# Patient Record
Sex: Male | Born: 1995 | Race: Black or African American | Hispanic: No | Marital: Single | Smoking: Current every day smoker
Health system: Southern US, Community
[De-identification: ages and names within clinical notes are randomized; demographics above are authoritative.]

## PROBLEM LIST (undated history)

## (undated) HISTORY — PX: TONSILLECTOMY: SUR1361

---

## 2016-01-17 ENCOUNTER — Emergency Department (HOSPITAL_COMMUNITY)
Admission: EM | Admit: 2016-01-17 | Discharge: 2016-01-17 | Disposition: A | Discharge status: Home / Self Care | Payer: Self-pay | Source: Home / Self Care

## 2016-01-17 NOTE — ED Notes (Signed)
No answer in waiting room 

## 2016-01-17 NOTE — ED Notes (Signed)
No answer x 2 

## 2016-01-18 ENCOUNTER — Encounter (HOSPITAL_COMMUNITY): Payer: Self-pay | Admitting: Emergency Medicine

## 2016-01-18 ENCOUNTER — Emergency Department (HOSPITAL_COMMUNITY)
Admission: EM | Admit: 2016-01-18 | Discharge: 2016-01-18 | Disposition: A | Discharge status: Home / Self Care | Payer: Self-pay | Attending: Emergency Medicine | Admitting: Emergency Medicine

## 2016-01-18 ENCOUNTER — Emergency Department (HOSPITAL_COMMUNITY): Payer: Self-pay

## 2016-01-18 DIAGNOSIS — K5900 Constipation, unspecified: Secondary | ICD-10-CM | POA: Insufficient documentation

## 2016-01-18 DIAGNOSIS — F17200 Nicotine dependence, unspecified, uncomplicated: Secondary | ICD-10-CM | POA: Insufficient documentation

## 2016-01-18 DIAGNOSIS — J069 Acute upper respiratory infection, unspecified: Secondary | ICD-10-CM | POA: Insufficient documentation

## 2016-01-18 DIAGNOSIS — R52 Pain, unspecified: Secondary | ICD-10-CM

## 2016-01-18 DIAGNOSIS — R059 Cough, unspecified: Secondary | ICD-10-CM

## 2016-01-18 DIAGNOSIS — H9203 Otalgia, bilateral: Secondary | ICD-10-CM | POA: Insufficient documentation

## 2016-01-18 DIAGNOSIS — R05 Cough: Secondary | ICD-10-CM

## 2016-01-18 DIAGNOSIS — J029 Acute pharyngitis, unspecified: Secondary | ICD-10-CM

## 2016-01-18 LAB — RAPID STREP SCREEN (MED CTR MEBANE ONLY): Streptococcus, Group A Screen (Direct): NEGATIVE

## 2016-01-18 MED ORDER — NAPROXEN 250 MG PO TABS
500.0000 mg | ORAL_TABLET | Freq: Once | ORAL | Status: AC
  Administered 2016-01-18: 500 mg via ORAL
  Filled 2016-01-18: qty 2

## 2016-01-18 MED ORDER — POLYETHYLENE GLYCOL 3350 17 G PO PACK: 17.0000 g | PACK | Freq: Every day | ORAL | Status: AC

## 2016-01-18 NOTE — ED Provider Notes (Signed)
History  By signing my name below, I, Alexander Weeks, attest that this documentation has been prepared under the direction and in the presence of [name], [employer]. Electronically Signed: Karle Weeks, ED Scribe. 01/18/2016. 10:51 AM.  Chief Complaint  Patient presents with  . Sore Throat  . Cough   Patient is a 20 y.o. male presenting with pharyngitis and cough. The history is provided by the patient and medical records. No language interpreter was used.  Sore Throat This is a new problem. The current episode started more than 2 days ago. The problem occurs constantly. The problem has not changed since onset.Associated symptoms include headaches. Pertinent negatives include no chest pain, no abdominal pain and no shortness of breath. Nothing aggravates the symptoms. The symptoms are relieved by acetaminophen. He has tried acetaminophen for the symptoms. The treatment provided mild relief.  Cough Cough characteristics:  Productive Sputum characteristics:  Yellow Duration:  3 days Timing:  Intermittent Progression:  Unchanged Chronicity:  New Relieved by:  Nothing Worsened by:  Nothing tried Associated symptoms: ear pain, headaches, myalgias (generalized), rhinorrhea, sinus congestion, sore throat and wheezing (improves with inhaler)   Associated symptoms: no chest pain, no chills, no eye discharge, no fever and no shortness of breath   Ear pain:    Location:  Bilateral Rhinorrhea:    Quality:  Yellow   Severity:  Mild   Duration:  3 days   Timing:  Intermittent   Progression:  Unchanged Sore throat:    Severity:  Mild   Duration:  3 days   Timing:  Intermittent   HPI Comments:  Alexander Weeks is a 20 y.o. male with a PMHx of asthma, who presents to the Emergency Department complaining of productive cough of yellow mucous that began two days ago. He reports associated HA, generalized body aches, bilateral otalgia, congestion, sore throat, wheezing, and yellow  rhinorrhea. He reports not having a bowel movement in 2-3 days but states he is still able to pass gas. He reports taking Tylenol with some relief of the symptoms. He has also used his home nebulizer machine with relief of the wheezing. He denies modifying factors. Pt denies any known sick contacts. He denies drooling, trismus, abdominal pain, fever, chills, CP, SOB, inability to swallow, vomiting, nausea, diarrhea, obstipation, dysuria, hematuria, numbness, tingling or weakness of any extremity.   History reviewed. No pertinent past medical history. Past Surgical History  Procedure Laterality Date  . Tonsillectomy     No family history on file. Social History  Substance Use Topics  . Smoking status: Current Every Day Smoker  . Smokeless tobacco: None  . Alcohol Use: Yes    Review of Systems  Constitutional: Negative for fever and chills.  HENT: Positive for congestion, ear pain, rhinorrhea and sore throat. Negative for drooling, ear discharge, hearing loss and trouble swallowing.   Eyes: Negative for pain, discharge and visual disturbance.  Respiratory: Positive for cough and wheezing (improves with inhaler). Negative for shortness of breath.   Cardiovascular: Negative for chest pain.  Gastrointestinal: Positive for constipation. Negative for nausea, vomiting, abdominal pain and diarrhea.  Genitourinary: Negative for dysuria and hematuria.  Musculoskeletal: Positive for myalgias (generalized).  Skin: Negative for color change.  Allergic/Immunologic: Negative for immunocompromised state.  Neurological: Positive for headaches. Negative for weakness and numbness.  Psychiatric/Behavioral: Negative for confusion.   A complete 10 system review of systems was obtained and all systems are negative except as noted in the HPI and PMH.   Allergies  Review of patient's allergies indicates no known allergies.  Home Medications   Prior to Admission medications   Not on File   Triage Vitals:  BP 147/63 mmHg  Pulse 84  Temp(Src) 99.4 F (37.4 C) (Oral)  Resp 20  SpO2 100% Physical Exam  Constitutional: He is oriented to person, place, and time. Vital signs are normal. He appears well-developed and well-nourished.  Non-toxic appearance. No distress.  Low-grade temp 99.4, nontoxic, NAD  HENT:  Head: Normocephalic and atraumatic.  Right Ear: Hearing, tympanic membrane, external ear and ear canal normal.  Left Ear: Hearing, tympanic membrane, external ear and ear canal normal.  Nose: Mucosal edema and rhinorrhea present.  Mouth/Throat: Uvula is midline and mucous membranes are normal. No trismus in the jaw. No uvula swelling. Posterior oropharyngeal erythema present. No oropharyngeal exudate, posterior oropharyngeal edema or tonsillar abscesses.  Ears are clear bilaterally. Nose with mucosal edema and rhinorrhea. Oropharynx injected, without uvular swelling or deviation, no trismus or drooling, no tonsillar swelling, no exudates. No PTA.  Eyes: Conjunctivae and EOM are normal. Right eye exhibits no discharge. Left eye exhibits no discharge.  Neck: Normal range of motion. Neck supple.  Cardiovascular: Normal rate, regular rhythm, normal heart sounds and intact distal pulses.  Exam reveals no gallop and no friction rub.   No murmur heard. Pulmonary/Chest: Effort normal and breath sounds normal. No respiratory distress. He has no decreased breath sounds. He has no wheezes. He has no rhonchi. He has no rales.  CTAB in all lung fields, no w/r/r, no hypoxia or increased WOB, speaking in full sentences, SpO2 100% on RA  Abdominal: Soft. Normal appearance and bowel sounds are normal. He exhibits no distension. There is no tenderness. There is no rigidity, no rebound, no guarding, no CVA tenderness, no tenderness at McBurney's point and negative Murphy's sign.  Soft, NTND, +BS throughout, no r/g/r, neg murphy's, neg mcburney's, no CVA TTP  Musculoskeletal: Normal range of motion.   Lymphadenopathy:    He has cervical adenopathy.  Shotty cervical LAD bilaterally  Neurological: He is alert and oriented to person, place, and time. He has normal strength. No sensory deficit.  Skin: Skin is warm, dry and intact. No rash noted.  Psychiatric: He has a normal mood and affect.  Nursing note and vitals reviewed.   ED Course  Procedures (including critical care time) DIAGNOSTIC STUDIES: Oxygen Saturation is 100% on RA, normal by my interpretation.   COORDINATION OF CARE: 9:54 AM- Will order CXR and rapid strep test. Pt verbalizes understanding and agrees to plan.  Medications  naproxen (NAPROSYN) tablet 500 mg (500 mg Oral Given 01/18/16 1021)    Labs Review Labs Reviewed  RAPID STREP SCREEN (NOT AT [facility])  CULTURE, GROUP A STREP [facility])   Imaging Review Dg Chest 2 View  01/18/2016  CLINICAL DATA:  Cough and fever.  History of smoking. EXAM: CHEST  2 VIEW COMPARISON:  None. FINDINGS: The heart size and mediastinal contours are within normal limits. Both lungs are clear. No pleural effusion or pneumothorax. The visualized skeletal structures are unremarkable. IMPRESSION: Normal chest radiographs. Electronically Signed   By: Amie Portland M.D.   On: 01/18/2016 10:30   I have personally reviewed and evaluated these images and lab results as part of my medical decision-making.   MDM   Final diagnoses:  URI (upper respiratory infection)  Cough  Pharyngitis  Body aches  Constipation, unspecified constipation type    20 y.o. male here with multiple complaints, mostly URI  complaints and some constipation. Low grade temp. Mild rhinorrhea, pharynx injected without PTA or tonsillar swelling, no exudates. Clear lung exam. No abdominal tenderness. Will give naprosyn for his body aches, and obtain CXR and RST. Will reassess shortly. Doubt need for abdominal work up, considering theres no tenderness, pt tolerating PO well, and doubt need for imaging/labs.   12:11 PM Xray neg.  RST neg. Likely viral URI. Pt is agreeable to symptomatic treatment with close follow up with [facility] in 1wk to establish care and as needed but spoke at length about emergent changing or worsening of symptoms that should prompt return to ER. Pt voices understanding and is agreeable to plan. Stable at time of discharge. Also dicussed miralax, high fiber diet, and increased water intake.  I personally performed the services described in this documentation, which was scribed in my presence. The recorded information has been reviewed and is accurate.  BP 144/84 mmHg  Pulse 80  Temp(Src) 99.6 F (37.6 C) (Oral)  Resp 16  SpO2 99%  Meds ordered this encounter  Medications  . naproxen (NAPROSYN) tablet 500 mg    Sig:   . polyethylene glycol (MIRALAX / GLYCOLAX) packet    Sig: Take 17 g by mouth daily.    Dispense:  14 each    Refill:  0    Order Specific Question:  Supervising Provider    Answer:  Eber Hong [[id number]]     Eddy Liszewski Camprubi-Soms, PA-C 01/18/16 1216  Gerhard Munch, MD 01/22/16 [phone]

## 2016-01-18 NOTE — Discharge Instructions (Signed)
Continue to stay well-hydrated. Gargle warm salt water and spit it out. Continue to alternate between Tylenol and Ibuprofen for pain or fever. Use Mucinex for cough suppression/expectoration of mucus. Use netipot and flonase to help with nasal congestion. May consider over-the-counter Benadryl or other antihistamine to decrease secretions and for watery itchy eyes.  For your constipation, use miralax as directed until you have daily soft stools, then as needed to continue having daily soft stools. Increase fiber and water intake.  Followup with [facility] in 5-7 days for recheck of ongoing symptoms and to establish care. Return to emergency department for emergent changing or worsening of symptoms.   Viral Infections A virus is a type of germ. Viruses can cause:  Minor sore throats.  Aches and pains.  Headaches.  Runny nose.  Rashes.  Watery eyes.  Tiredness.  Coughs.  Loss of appetite.  Feeling sick to your stomach (nausea).  Throwing up (vomiting).  Watery poop (diarrhea). HOME CARE   Only take medicines as told by your doctor.  Drink enough water and fluids to keep your pee (urine) clear or pale yellow. Sports drinks are a good choice.  Get plenty of rest and eat healthy. Soups and broths with crackers or rice are fine. GET HELP RIGHT AWAY IF:   You have a very bad headache.  You have shortness of breath.  You have chest pain or neck pain.  You have an unusual rash.  You cannot stop throwing up.  You have watery poop that does not stop.  You cannot keep fluids down.  You or your child has a temperature by mouth above 102 F (38.9 C), not controlled by medicine.  Your baby is older than 3 months with a rectal temperature of 102 F (38.9 C) or higher.  Your baby is 15 months old or younger with a rectal temperature of 100.4 F (38 C) or higher. MAKE SURE YOU:   Understand these instructions.  Will watch this condition.  Will get help  right away if you are not doing well or get worse.   This information is not intended to replace advice given to you by your health care provider. Make sure you discuss any questions you have with your health care provider.   Document Released: 11/09/2008 Document Revised: 02/19/2012 Document Reviewed: 05/05/2015 Elsevier Interactive Patient Education 2016 Elsevier Inc.  Sore Throat A sore throat is a painful, burning, sore, or scratchy feeling of the throat. There may be pain or tenderness when swallowing or talking. You may have other symptoms with a sore throat. These include coughing, sneezing, fever, or a swollen neck. A sore throat is often the first sign of another sickness. These sicknesses may include a cold, flu, strep throat, or an infection called mono. Most sore throats go away without medical treatment.  HOME CARE   Only take medicine as told by your doctor.  Drink enough fluids to keep your pee (urine) clear or pale yellow.  Rest as needed.  Try using throat sprays, lozenges, or suck on hard candy (if older than 4 years or as told).  Sip warm liquids, such as broth, herbal tea, or warm water with honey. Try sucking on frozen ice pops or drinking cold liquids.  Rinse the mouth (gargle) with salt water. Mix 1 teaspoon salt with 8 ounces of water.  Do not smoke. Avoid being around others when they are smoking.  Put a humidifier in your bedroom at night to moisten the air.  You can also turn on a hot shower and sit in the bathroom for 5-10 minutes. Be sure the bathroom door is closed. GET HELP RIGHT AWAY IF:   You have trouble breathing.  You cannot swallow fluids, soft foods, or your spit (saliva).  You have more puffiness (swelling) in the throat.  Your sore throat does not get better in 7 days.  You feel sick to your stomach (nauseous) and throw up (vomit).  You have a fever or lasting symptoms for more than 2-3 days.  You have a fever and your symptoms suddenly  get worse. MAKE SURE YOU:   Understand these instructions.  Will watch your condition.  Will get help right away if you are not doing well or get worse.   This information is not intended to replace advice given to you by your health care provider. Make sure you discuss any questions you have with your health care provider.   Document Released: 09/05/2008 Document Revised: 08/21/2012 Document Reviewed: 08/04/2012 Elsevier Interactive Patient Education 2016 [employer].  Constipation, Adult Constipation is when a person:  Poops (has a bowel movement) less than 3 times a week.  Has a hard time pooping.  Has poop that is dry, hard, or bigger than normal. HOME CARE   Eat foods with a lot of fiber in them. This includes fruits, vegetables, beans, and whole grains such as brown rice.  Avoid fatty foods and foods with a lot of sugar. This includes french fries, hamburgers, cookies, candy, and soda.  If you are not getting enough fiber from food, take products with added fiber in them (supplements).  Drink enough fluid to keep your pee (urine) clear or pale yellow.  Exercise on a regular basis, or as told by your doctor.  Go to the restroom when you feel like you need to poop. Do not hold it.  Only take medicine as told by your doctor. Do not take medicines that help you poop (laxatives) without talking to your doctor first. GET HELP RIGHT AWAY IF:   You have bright red blood in your poop (stool).  Your constipation lasts more than 4 days or gets worse.  You have belly (abdominal) or butt (rectal) pain.  You have thin poop (as thin as a pencil).  You lose weight, and it cannot be explained. MAKE SURE YOU:   Understand these instructions.  Will watch your condition.  Will get help right away if you are not doing well or get worse.   This information is not intended to replace advice given to you by your health care provider. Make sure you discuss any questions you  have with your health care provider.   Document Released: 05/15/2008 Document Revised: 12/18/2014 Document Reviewed: 09/08/2013 Elsevier Interactive Patient Education 2016 Elsevier Inc.  High-Fiber Diet Fiber, also called dietary fiber, is a type of carbohydrate found in fruits, vegetables, whole grains, and beans. A high-fiber diet can have many health benefits. Your health care provider may recommend a high-fiber diet to help:  Prevent constipation. Fiber can make your bowel movements more regular.  Lower your cholesterol.  Relieve hemorrhoids, uncomplicated diverticulosis, or irritable bowel syndrome.  Prevent overeating as part of a weight-loss plan.  Prevent heart disease, type 2 diabetes, and certain cancers. WHAT IS MY PLAN? The recommended daily intake of fiber includes:  38 grams for men under age 37.  30 grams for men over age 4.  25 grams for women under age 47.  21 grams for  women over age 7. You can get the recommended daily intake of dietary fiber by eating a variety of fruits, vegetables, grains, and beans. Your health care provider may also recommend a fiber supplement if it is not possible to get enough fiber through your diet. WHAT DO I NEED TO KNOW ABOUT A HIGH-FIBER DIET?  Fiber supplements have not been widely studied for their effectiveness, so it is better to get fiber through food sources.  Always check the fiber content on thenutrition facts label of any prepackaged food. Look for foods that contain at least 5 grams of fiber per serving.  Ask your dietitian if you have questions about specific foods that are related to your condition, especially if those foods are not listed in the following section.  Increase your daily fiber consumption gradually. Increasing your intake of dietary fiber too quickly may cause bloating, cramping, or gas.  Drink plenty of water. Water helps you to digest fiber. WHAT FOODS CAN I EAT? Grains Whole-grain breads.  Multigrain cereal. Oats and oatmeal. Brown rice. Barley. Bulgur wheat. Millet. Bran muffins. Popcorn. Rye wafer crackers. Vegetables Sweet potatoes. Spinach. Kale. Artichokes. Cabbage. Broccoli. Green peas. Carrots. Squash. Fruits Berries. Pears. Apples. Oranges. Avocados. Prunes and raisins. Dried figs. Meats and Other Protein Sources Navy, kidney, pinto, and soy beans. Split peas. Lentils. Nuts and seeds. Dairy Fiber-fortified yogurt. Beverages Fiber-fortified soy milk. Fiber-fortified orange juice. Other Fiber bars. The items listed above may not be a complete list of recommended foods or beverages. Contact your dietitian for more options. WHAT FOODS ARE NOT RECOMMENDED? Grains White bread. Pasta made with refined flour. White rice. Vegetables Fried potatoes. Canned vegetables. Well-cooked vegetables.  Fruits Fruit juice. Cooked, strained fruit. Meats and Other Protein Sources Fatty cuts of meat. Fried Environmental education officer or fried fish. Dairy Milk. Yogurt. Cream cheese. Sour cream. Beverages Soft drinks. Other Cakes and pastries. Butter and oils. The items listed above may not be a complete list of foods and beverages to avoid. Contact your dietitian for more information. WHAT ARE SOME TIPS FOR INCLUDING HIGH-FIBER FOODS IN MY DIET?  Eat a wide variety of high-fiber foods.  Make sure that half of all grains consumed each day are whole grains.  Replace breads and cereals made from refined flour or white flour with whole-grain breads and cereals.  Replace white rice with brown rice, bulgur wheat, or millet.  Start the day with a breakfast that is high in fiber, such as a cereal that contains at least 5 grams of fiber per serving.  Use beans in place of meat in soups, salads, or pasta.  Eat high-fiber snacks, such as berries, raw vegetables, nuts, or popcorn.   This information is not intended to replace advice given to you by your health care provider. Make sure you discuss any  questions you have with your health care provider.   Document Released: 11/27/2005 Document Revised: 12/18/2014 Document Reviewed: 05/12/2014 Elsevier Interactive Patient Education 2016 Elsevier Inc.  Cough, Adult Coughing is a reflex that clears your throat and your airways. Coughing helps to heal and protect your lungs. It is normal to cough occasionally, but a cough that happens with other symptoms or lasts a long time may be a sign of a condition that needs treatment. A cough may last only 2-3 weeks (acute), or it may last longer than 8 weeks (chronic). CAUSES Coughing is commonly caused by:  Breathing in substances that irritate your lungs.  A viral or bacterial respiratory infection.  Allergies.  Asthma.  Postnasal drip.  Smoking.  Acid backing up from the stomach into the esophagus (gastroesophageal reflux).  Certain medicines.  Chronic lung problems, including COPD (or rarely, lung cancer).  Other medical conditions such as heart failure. HOME CARE INSTRUCTIONS  Pay attention to any changes in your symptoms. Take these actions to help with your discomfort:  Take medicines only as told by your health care provider.  If you were prescribed an antibiotic medicine, take it as told by your health care provider. Do not stop taking the antibiotic even if you start to feel better.  Talk with your health care provider before you take a cough suppressant medicine.  Drink enough fluid to keep your urine clear or pale yellow.  If the air is dry, use a cold steam vaporizer or humidifier in your bedroom or your home to help loosen secretions.  Avoid anything that causes you to cough at work or at home.  If your cough is worse at night, try sleeping in a semi-upright position.  Avoid cigarette smoke. If you smoke, quit smoking. If you need help quitting, ask your health care provider.  Avoid caffeine.  Avoid alcohol.  Rest as needed. SEEK MEDICAL CARE IF:   You have  new symptoms.  You cough up pus.  Your cough does not get better after 2-3 weeks, or your cough gets worse.  You cannot control your cough with suppressant medicines and you are losing sleep.  You develop pain that is getting worse or pain that is not controlled with pain medicines.  You have a fever.  You have unexplained weight loss.  You have night sweats. SEEK IMMEDIATE MEDICAL CARE IF:  You cough up blood.  You have difficulty breathing.  Your heartbeat is very fast.   This information is not intended to replace advice given to you by your health care provider. Make sure you discuss any questions you have with your health care provider.   Document Released: 05/26/2011 Document Revised: 08/18/2015 Document Reviewed: 02/03/2015 Elsevier Interactive Patient Education [employer]

## 2016-01-18 NOTE — ED Notes (Addendum)
Started 3 days ago with sore throat, bilateral ear aches, cough w/ yellow sputum, body aches.

## 2016-01-20 LAB — CULTURE, GROUP A STREP (THRC)

## 2017-02-17 IMAGING — DX DG CHEST 2V
2 series · 2 of 2 positions shown · non-contrast
Comparison: None.

CLINICAL DATA: Cough and fever.  History of smoking.

EXAM:
CHEST  2 VIEW

[chest pa]
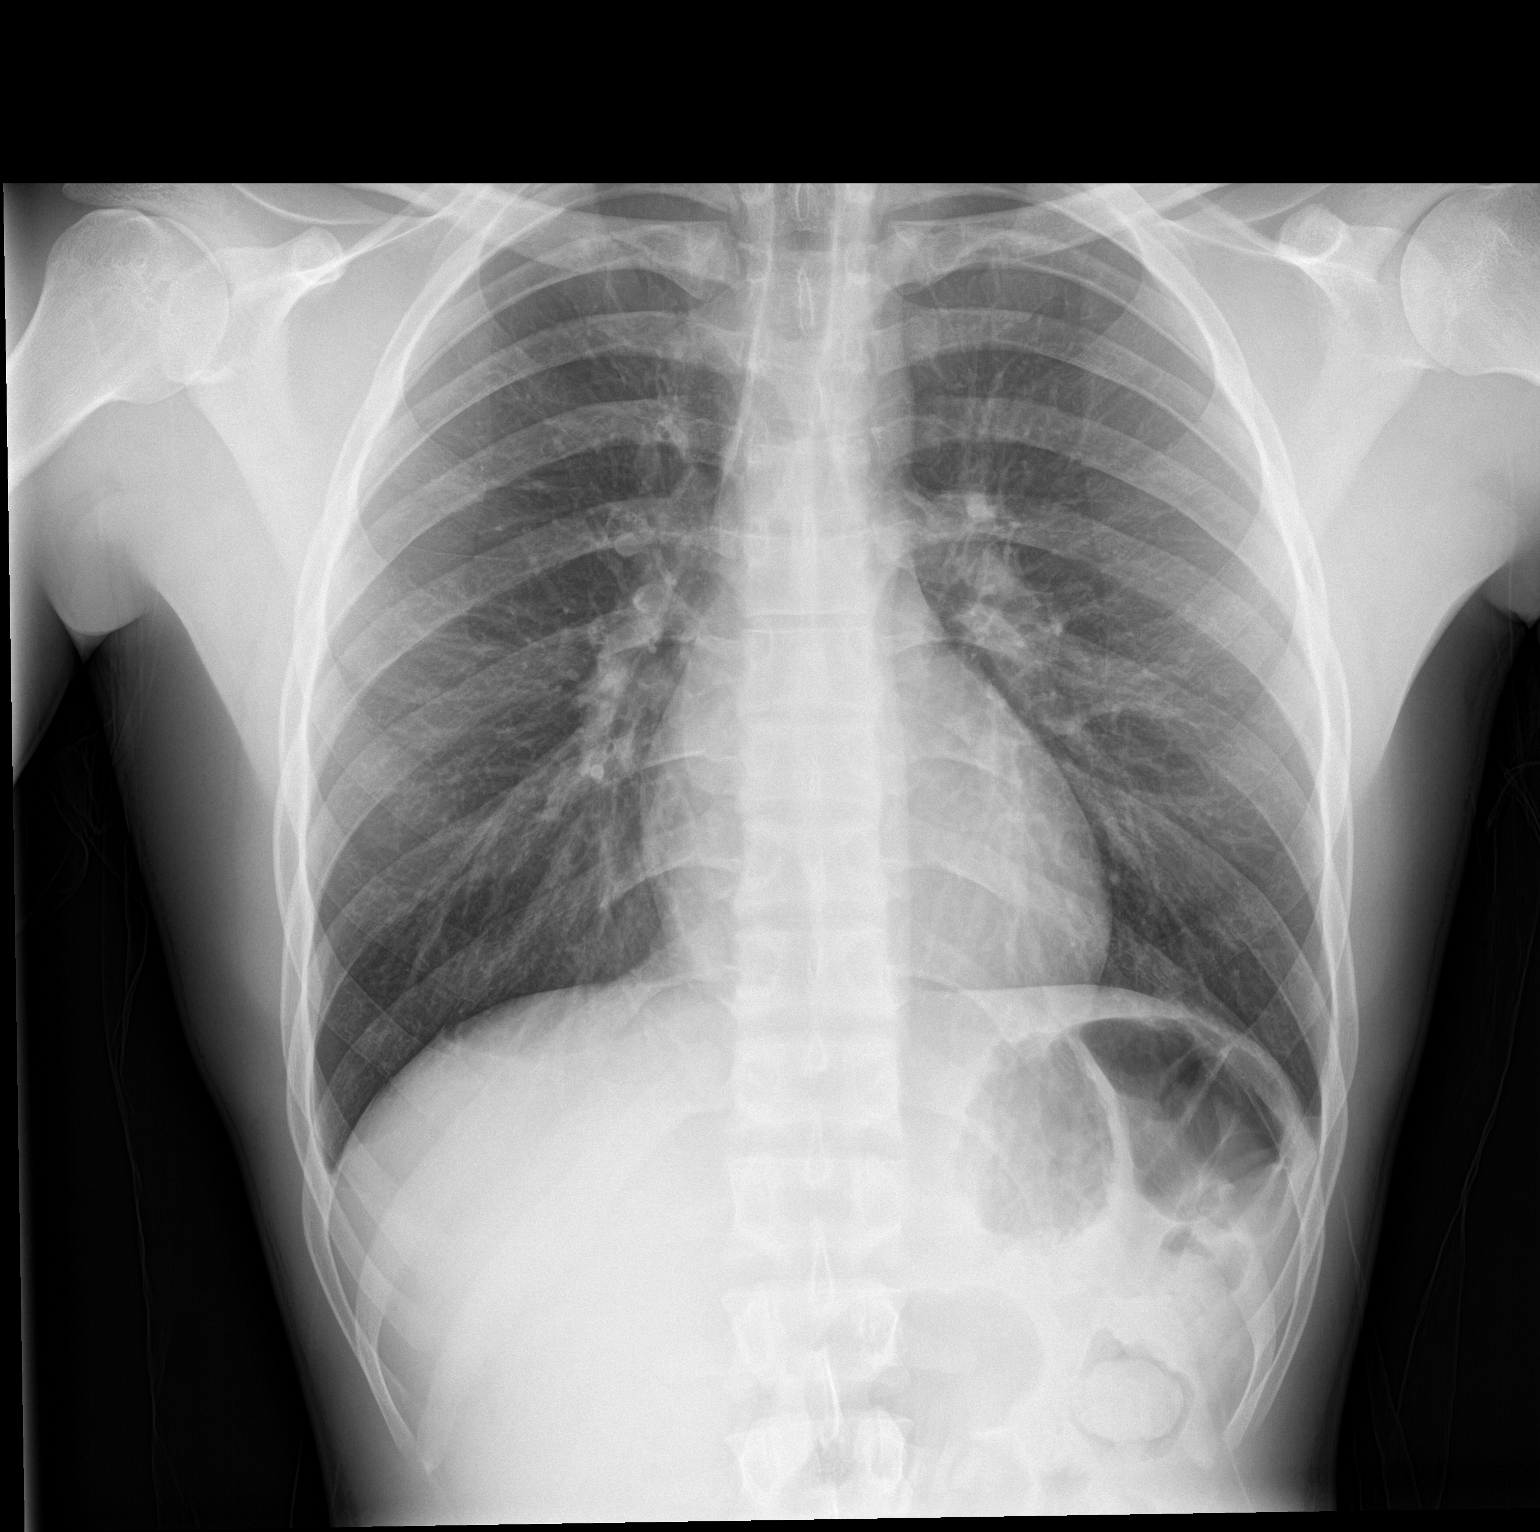

[chest lat]
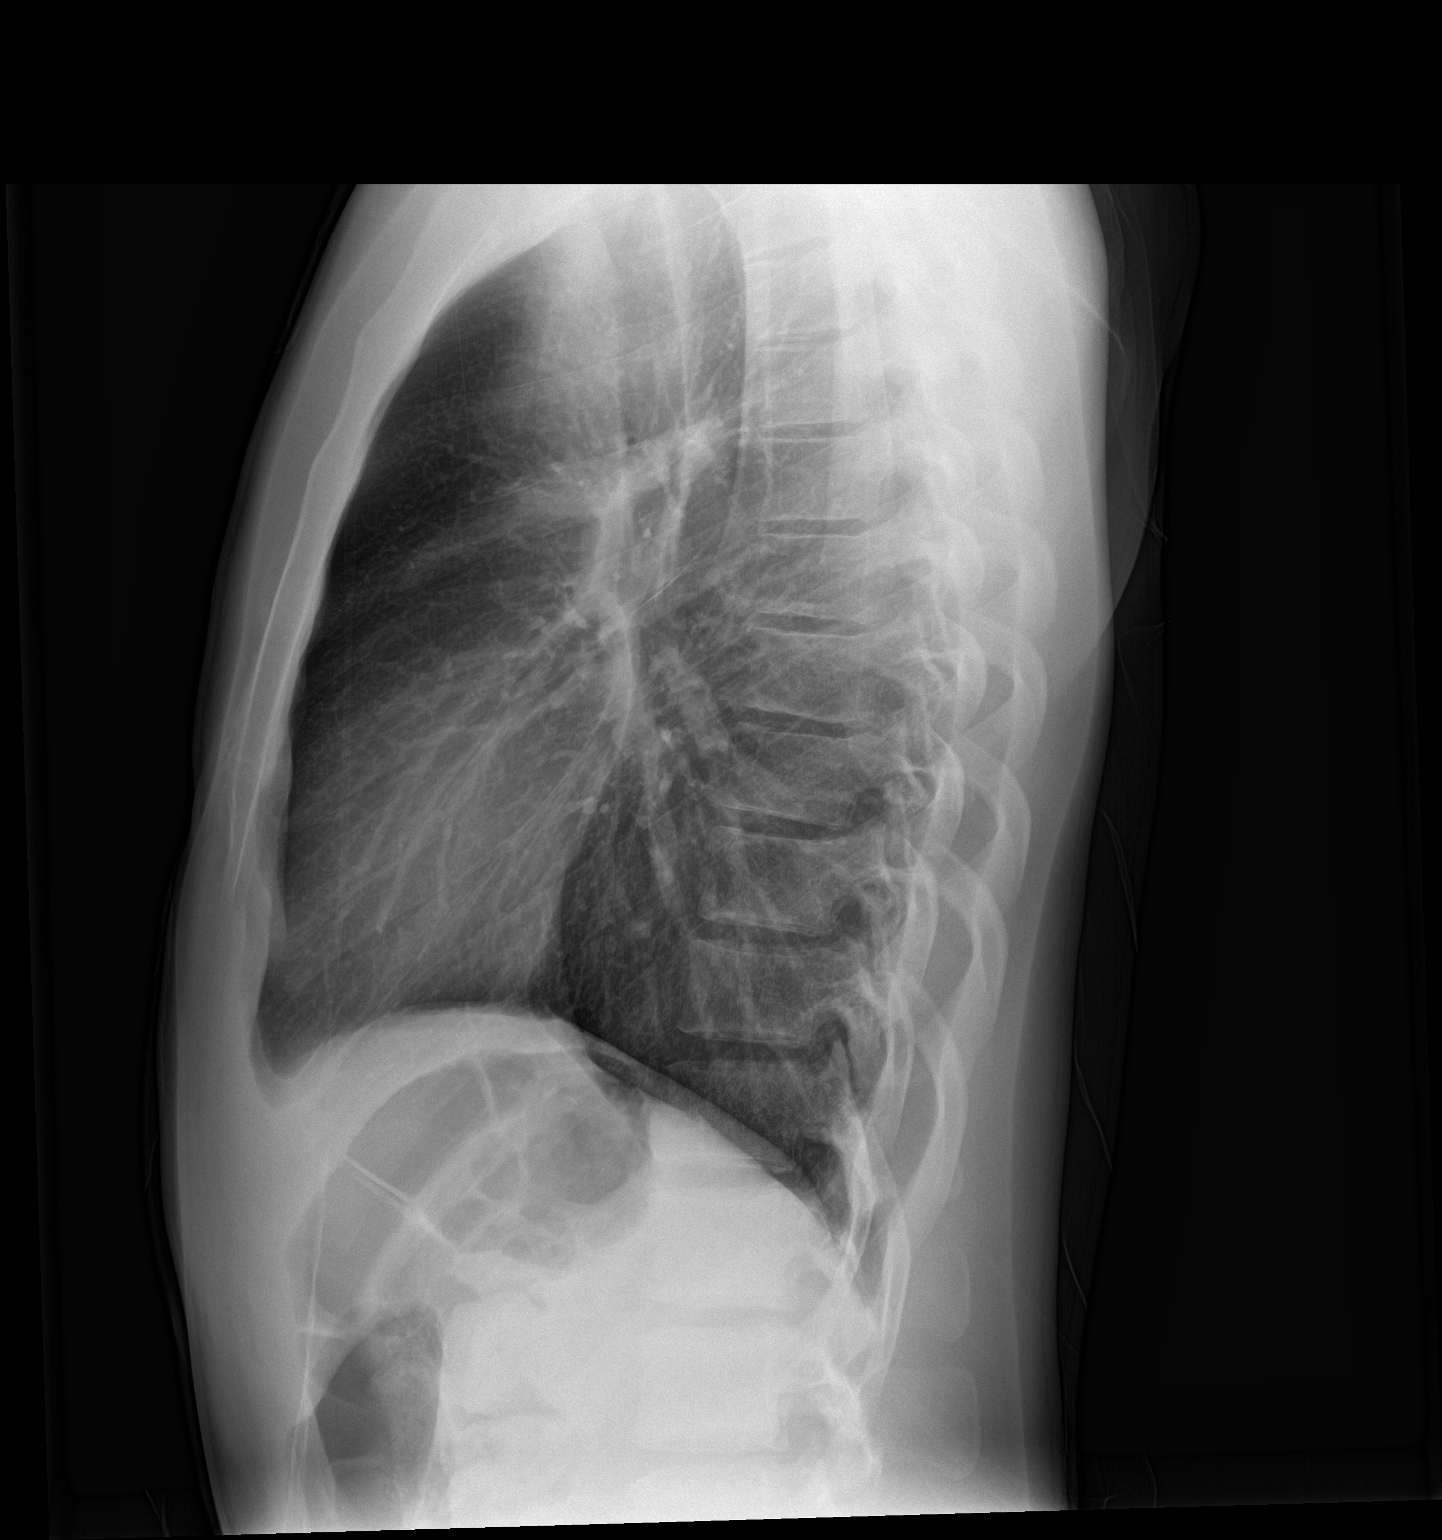

[2 of 2 positions shown; findings below may reference images not displayed]

FINDINGS: The heart size and mediastinal contours are within normal limits.
Both lungs are clear. No pleural effusion or pneumothorax. The
visualized skeletal structures are unremarkable.
IMPRESSION: Normal chest radiographs.
